# Patient Record
Sex: Male | Born: 1970 | Race: White | Hispanic: No | Marital: Single | Smoking: Current some day smoker
Health system: Southern US, Community
[De-identification: ages and names within clinical notes are randomized; demographics above are authoritative.]

---

## 2006-08-13 ENCOUNTER — Encounter (INDEPENDENT_AMBULATORY_CARE_PROVIDER_SITE_OTHER): Payer: Self-pay | Admitting: Specialist

## 2006-08-14 ENCOUNTER — Observation Stay (HOSPITAL_COMMUNITY): Admission: EM | Admit: 2006-08-14 | Discharge: 2006-08-14 | Payer: Self-pay | Admitting: Emergency Medicine

## 2007-03-24 IMAGING — CR DG ABDOMEN 2V
2 series · 2 of 2 positions shown · non-contrast
Comparison: None

CLINICAL DATA: Abdominal pain

ABDOMEN - 2 VIEW

[w abdomen upright]
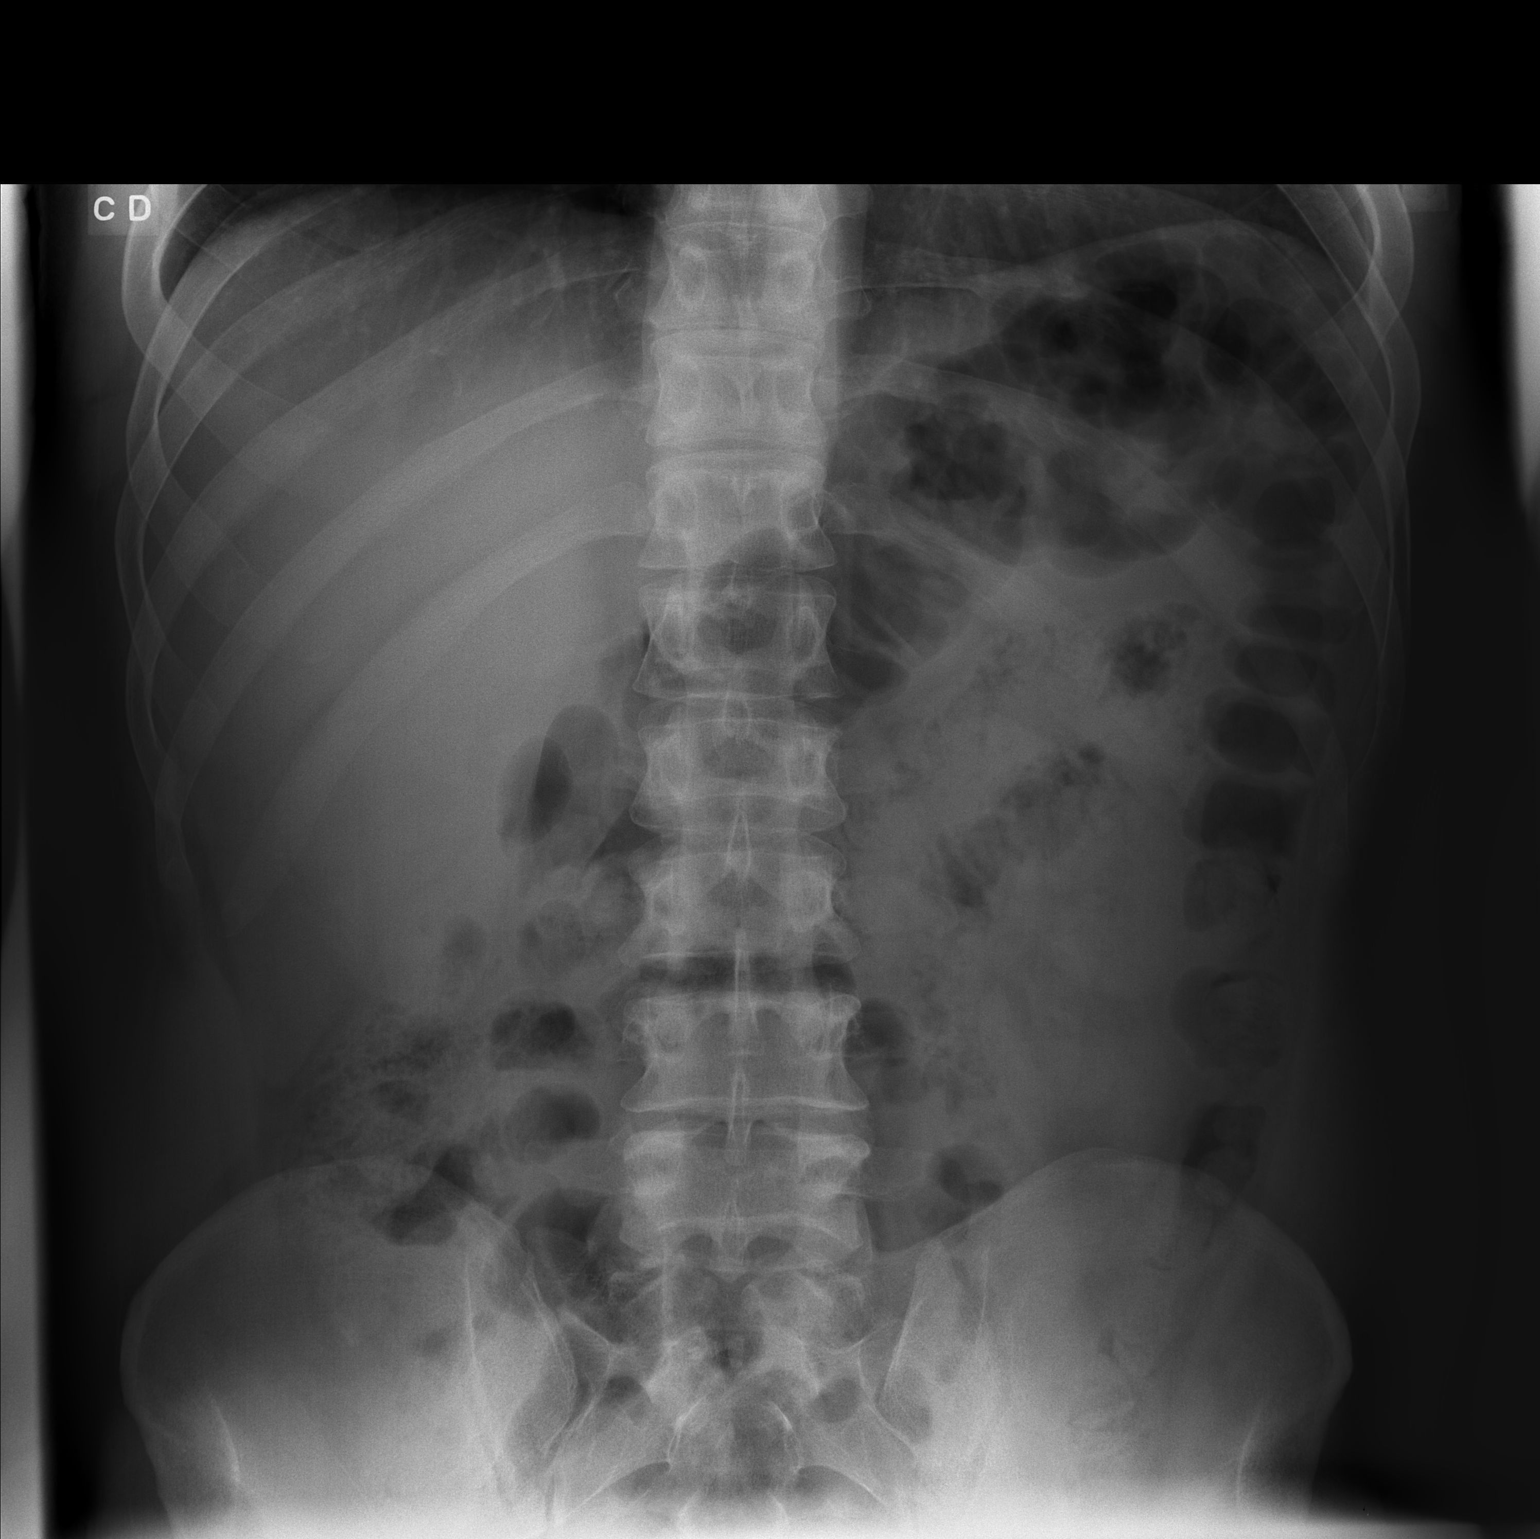

[t abdomen supine]
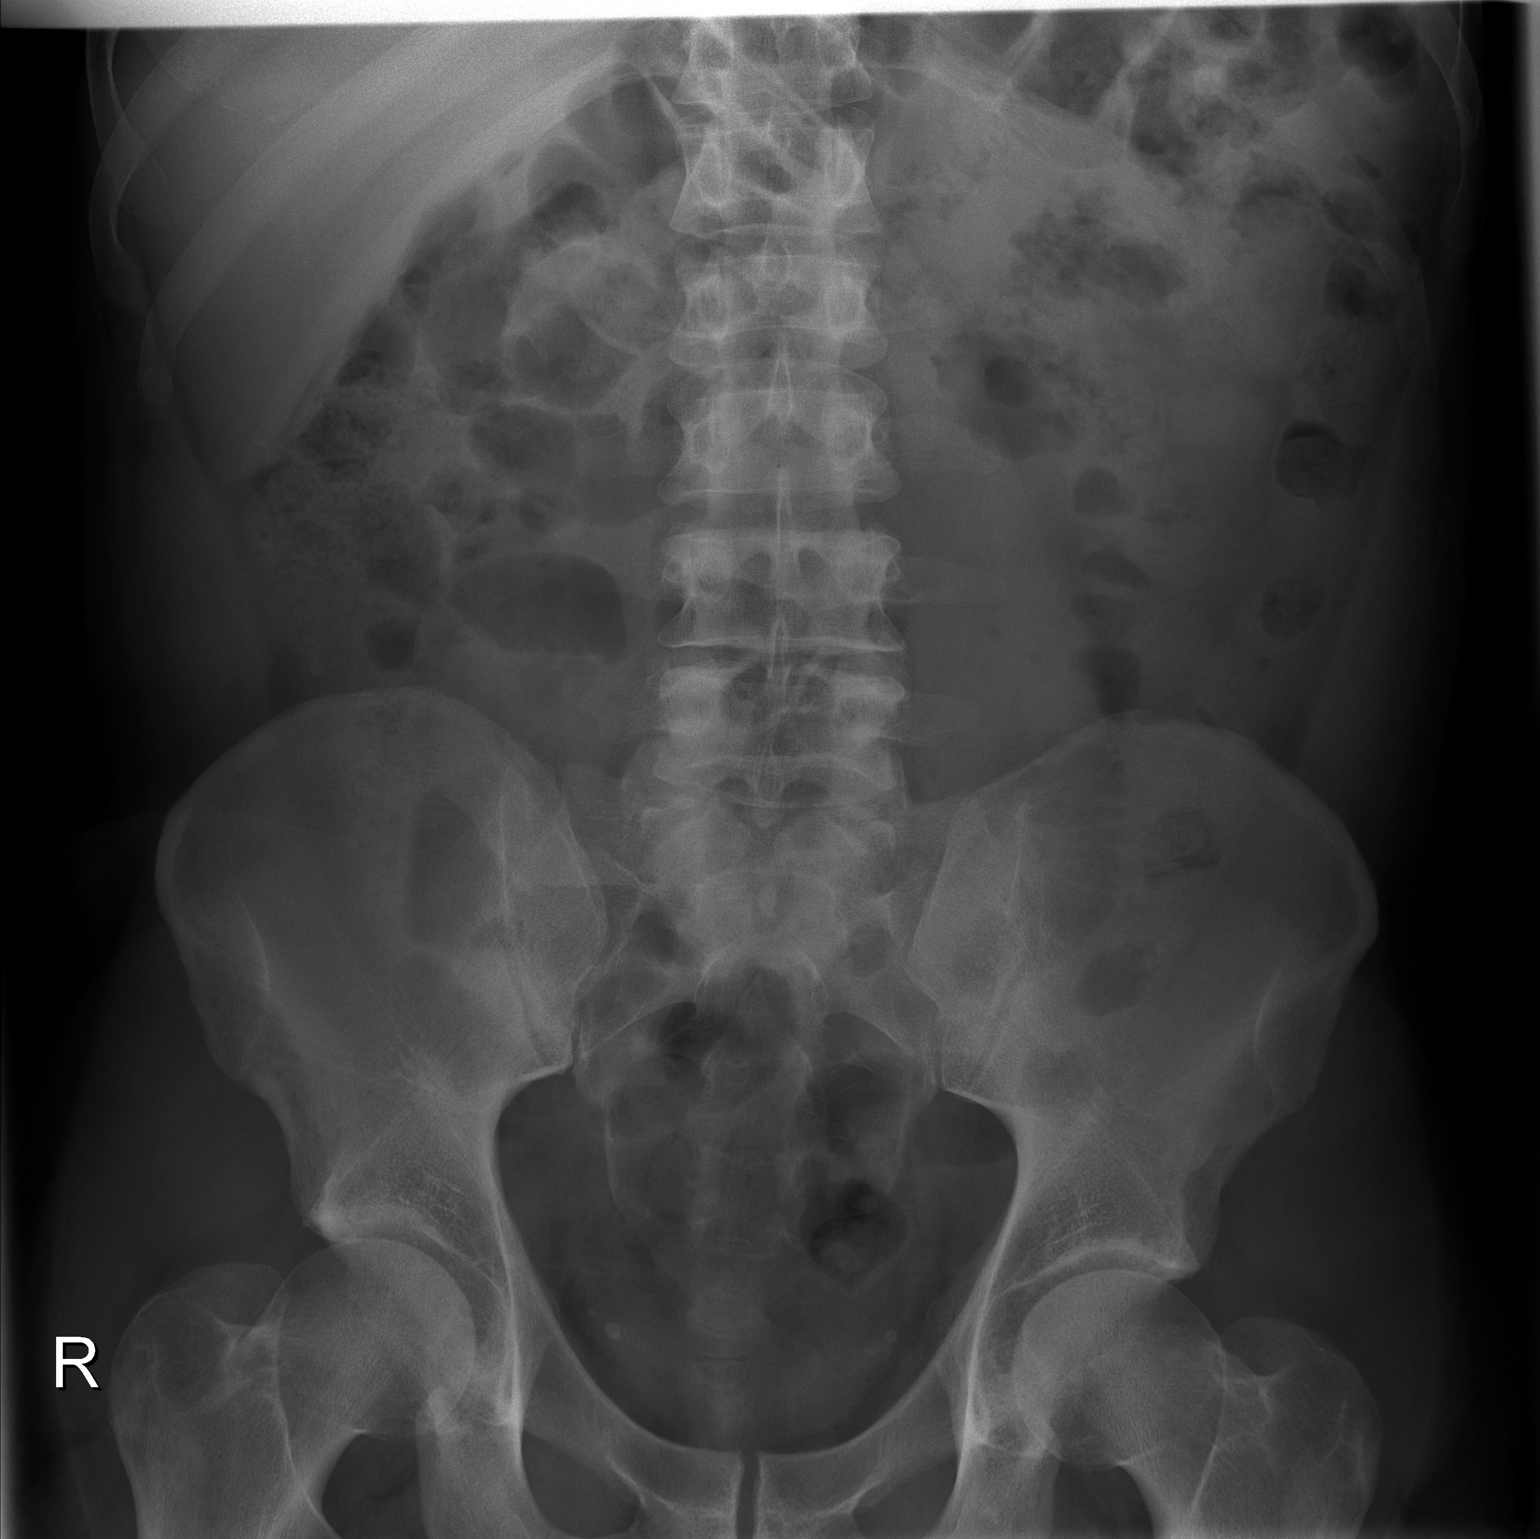

[2 of 2 positions shown; findings below may reference images not displayed]

FINDINGS: There is a nonobstructive bowel gas pattern. No free air,
organomegaly or suspicious calcification. Visualized skeleton unremarkable.

IMPRESSION

No obstruction or free air.

## 2009-12-12 ENCOUNTER — Telehealth (INDEPENDENT_AMBULATORY_CARE_PROVIDER_SITE_OTHER): Payer: Self-pay | Admitting: *Deleted

## 2009-12-26 ENCOUNTER — Ambulatory Visit: Payer: Self-pay | Admitting: Internal Medicine

## 2009-12-26 DIAGNOSIS — J309 Allergic rhinitis, unspecified: Secondary | ICD-10-CM | POA: Insufficient documentation

## 2009-12-26 LAB — CONVERTED CEMR LAB
Basophils Absolute: 0 10*3/uL (ref 0.0–0.1)
Eosinophils Absolute: 0.1 10*3/uL (ref 0.0–0.7)
IgE (Immunoglobulin E), Serum: 8.5 intl units/mL (ref 0.0–180.0)
Lymphocytes Relative: 33 % (ref 12.0–46.0)
MCHC: 35.1 g/dL (ref 30.0–36.0)
Neutrophils Relative %: 53.9 % (ref 43.0–77.0)
Platelets: 222 10*3/uL (ref 150.0–400.0)
RBC: 5.21 M/uL (ref 4.22–5.81)
RDW: 13.1 % (ref 11.5–14.6)

## 2010-02-22 ENCOUNTER — Ambulatory Visit: Payer: Self-pay | Admitting: Internal Medicine

## 2010-02-22 DIAGNOSIS — J328 Other chronic sinusitis: Secondary | ICD-10-CM | POA: Insufficient documentation

## 2010-10-23 ENCOUNTER — Telehealth (INDEPENDENT_AMBULATORY_CARE_PROVIDER_SITE_OTHER): Payer: Self-pay | Admitting: *Deleted

## 2010-10-24 NOTE — Miscellaneous (Signed)
Summary: Skin Test/[facility]  Skin Test/[facility]   Imported By: Sherian Rein 03/01/2010 13:10:22  _____________________________________________________________________  External Attachment:    Type:   Image     Comment:   External Document

## 2010-10-24 NOTE — Assessment & Plan Note (Signed)
Summary: aleergy skin test//kp   Vital Signs:  Patient profile:   40 year old male Height:      72 inches Weight:      230 pounds BMI:     31.31 O2 Sat:      96 % on Room air Pulse rate:   77 / minute BP sitting:   130 / 80  (left arm) Cuff size:   large  Vitals Entered By: Reynaldo Minium CMA (February 22, 2010 2:02 PM)  O2 Flow:  Room air  Primary Provider/Referring Provider:  Elvera Lennox PA  CC:  Allergy testing.  History of Present Illness: History of Present Illness: December 26, 2009- 38 yoM seen for allergy evaluation at kind request of Elvera Lennox, [place], [place] [facility].Never smoker who has a family member seen here. He reports sinus congestion with headaches that put him to bed as a child. Starting again in the 1990's he has noted seasonal problem with bad sore throats/ strep, in the Spring, Fall and Winter. Sneezing and nasal congestion have become perennial. Frequent retroorbital headache, postnasal drip, ears pop. Last Fall he was given prednisone and antivert. Most recent antibiotic was Z pak. In the past has used Rhinocort and fluticasone. Recently trying Allegra. Has not had CT of sinuses, or any ENT surgery. Denies lower respiratory wheeze, cough or chest tightness. Childhood rash from strawberies, food coloring. Environment- House, crawlspace, Shitzu dog and a cat, no smokers, no mold, 3 children. Office based job.  February 22, 2010- Allergic rhinitis, chronic rhinosinusitis History reviewed. Thinks he is in "4th round' on nasal congestion" since January. Sometimes hears a little wheeze. Has not had a rescue inhaler.  Last allegra may have been 48 hours ago- not less. CBC- NL IgE 8.5, Specific allergy profile- Neg  Skin test- Pos: Dust, Mold, Grass, Weed   Preventive Screening-Counseling & Management  Alcohol-Tobacco     Smoking Status: never  Current Medications (verified): 1)  Fluticasone Propionate 50 Mcg/act Susp (Fluticasone Propionate) .Marland Kitchen.. 1-2 Sprays in Each  Nostril Two Times A Day 2)  Allegra 180 Mg Tabs (Fexofenadine Hcl) .... Take 1 By Mouth Once Daily  Allergies (verified): No Known Drug Allergies  Past History:  Past Surgical History: Last updated: 12/26/2009 Appendectomy  Family History: Last updated: 12/26/2009 Cancer: Father-skin,brain tumor               Mother-Liver, died Sister- asthma  Social History: Last updated: 12/26/2009 Married with children Non-smoker; occasionally cigar (once a year). 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  The patient denies shortness of breath with activity, shortness of breath at rest, productive cough, non-productive cough, coughing up blood, chest pain, irregular heartbeats, acid heartburn, indigestion, loss of appetite, weight change, abdominal pain, difficulty swallowing, sore throat, tooth/dental problems, and headaches.    Physical Exam  Additional Exam:  General: A/Ox3; pleasant and cooperative, NAD, healthy appearing SKIN: no rash, lesions NODES: no lymphadenopathy HEENT: [place]/AT, EOM- WNL, Conjuctivae- clear, PERRLA, TM-WNL, Nose- turbinate edema, sniffing Throat- clear and wnl, Mallampati  II-III NECK: Supple w/ fair ROM, JVD- none, normal carotid impulses w/o bruits Thyroid- normal to palpation CHEST: transient wheeze right scapula HEART:  RRR, no m/g/r heard ABDOMEN: Soft and nl; ZOX:WRUE, nl pulses, no edema  NEURO: Grossly intact to observation     Impression & Recommendations:  Problem # 1:  RHINOSINUSITIS, CHRONIC (ICD-473.8) Combination of recurrent infections and seasonal allergy. We discussed dust control measures, exposure to his [place] childrren,  antihistamines, nasal saline rinse and nasal steroids. He will return in the Fall for update. Allergy vaccine and CT sinus are considerations.  Problem # 2:  RHINOSINUSITIS, ALLERGIC (ICD-477.9) We reviewed and compared skin test with in vitro results. Discussed role of nasal steroids. His updated medication list for this problem includes:    Fluticasone Propionate 50 Mcg/act Susp (Fluticasone propionate) .Marland Kitchen... 1-2 sprays in each nostril two times a day    Allegra 180 Mg Tabs (Fexofenadine hcl) .Marland Kitchen... Take 1 by mouth once daily  Other Orders: Est. Patient Level III (45409) Allergy Puncture Test (81191) Allergy I.D Test (47829)  Patient Instructions: 1)  Please schedule a follow-up appointment in 3 months. 2)  Consider the dust control measures we discussed for your home and work place. 3)  Consider regular use of a nasal saline sinus rinse when your see trouble coming. Continue use of nasal steroid spray, and use antihistamine when needed.

## 2010-10-24 NOTE — Progress Notes (Signed)
Summary: patient needs sooner apt  Phone Note From Other Clinic Call back at [phone]   Caller: shirley ([facility]) Call For: Brian Pitts Reason for Call: Schedule Patient Appt Summary of Call: pat has an apt on 4/12. scott gurley (the pa at [facility]) would like him to be worked in sooner if possible.  Initial call taken by: Valinda Hoar,  December 12, 2009 12:15 PM  Follow-up for Phone Call        Please advise of any sooner appts for this pt.Brian Pitts [facility]  December 12, 2009 12:23 PM  Additional Follow-up for Phone Call Additional follow up Details #1::        Please check that an earlier appt was made- Florentina Addison was to work on this. Speak to me about use of any RN slots. Additional Follow-up by: Waymon Budge MD,  December 13, 2009 10:41 AM    Additional Follow-up for Phone Call Additional follow up Details #2::    spoke to shirley at [facility] ph# [phone], offered appt for 4/4 at 11:15, pt to arrive at 11am for allergy consult per scott gurley the pa there, this appt was approved by dr Brian Pitts. shirley states she will call pt with all info and also make pa aware, i told her if this appt did not work for pt to give [place] a call back and we could speak to cy again Follow-up by: Philipp Deputy CMA,  December 13, 2009 11:30 AM

## 2010-10-24 NOTE — Assessment & Plan Note (Signed)
Summary: allergy symptoms per scott gurley-PA @ [facility]   Vital Signs:  Patient profile:   40 year old male Height:      72 inches Weight:      228.38 pounds BMI:     31.09 O2 Sat:      96 % on Room air Pulse rate:   69 / minute BP sitting:   132 / 74  (left arm) Cuff size:   regular  Vitals Entered By: Reynaldo Minium CMA (December 26, 2009 11:16 AM)  O2 Flow:  Room air  Primary Provider/Referring Provider:  Elvera Lennox PA   History of Present Illness: December 26, 2009- 38 yoM seen for allergy evaluation at kind request of Elvera Lennox, [place], [place] [facility].Never smoker who has a family member seen here. He reports sinus congestion with headaches that put him to bed as a child. Starting again in the 1990's he has noted seasonal problem with bad sore throats/ strep, in the Spring, Fall and Winter. Sneezing and nasal congestion have become perennial. Frequent retroorbital headache, postnasal drip, ears pop. Last Fall he was given prednisone and antivert. Most recent antibiotic was Z pak. In the past has used Rhinocort and fluticasone. Recently trying Allegra. Has not had CT of sinuses, or any ENT surgery. Denies lower respiratory wheeze, cough or chest tightness. Childhood rash from strawberies, food coloring. Environment- House, crawlspace, Shitzu dog and a cat, no smokers, no mold, 3 children. Office based job.  Current Medications (verified): 1)  Fluticasone Propionate 50 Mcg/act Susp (Fluticasone Propionate) .Marland Kitchen.. 1-2 Sprays in Each Nostril Two Times A Day 2)  Allegra 180 Mg Tabs (Fexofenadine Hcl) .... Take 1 By Mouth Once Daily  Allergies (verified): No Known Drug Allergies  Past History:  Family History: Last updated: 12/26/2009 Cancer: Father-skin,brain tumor               Mother-Liver, died Sister- asthma  Social History: Last updated: 12/26/2009 Married with children Non-smoker; occasionally cigar (once a year). 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  The patient denies shortness of breath with activity, shortness of breath at rest, productive cough, non-productive cough, coughing up blood, chest pain, irregular heartbeats, acid heartburn, indigestion, loss of appetite, weight change, abdominal pain, difficulty swallowing, tooth/dental problems, itching, anxiety, depression, hand/feet swelling, joint stiffness or pain, rash, change in color of mucus, and fever.    Physical Exam  Additional Exam:  General: A/Ox3; pleasant and cooperative, NAD, healthy appearing SKIN: no rash, lesions NODES: no lymphadenopathy HEENT: [place]/AT, EOM- WNL, Conjuctivae- clear, PERRLA, TM-WNL, Nose- turbinate edema, Throat- clear and wnl, Mallampati  II-III NECK: Supple w/ fair ROM, JVD- none, normal carotid impulses w/o bruits Thyroid- normal to palpation CHEST: Clear to P&A HEART: RRR, no m/g/r heard ABDOMEN: Soft and nl; nml bowel sounds; no organomegaly or masses noted ZOX:WRUE, nl pulses, no edema  NEURO: Grossly intact to observation     Impression & Recommendations:  Problem # 1:  RHINOSINUSITIS, ALLERGIC (ICD-477.9)  He gets congested episodically and has a lot of exposure to young people, so a signigificant infectious/ viral component may be present. There does seem to be an atopic component as well. We will have him use the fluticasone regularly now, while he is open. Add Sudafed PE when needed. Get in vitro specific IgE now  and return foir Skin Testing. Consider CT sinuses later. His updated medication list for this problem includes:    Fluticasone Propionate 50 Mcg/act Susp (Fluticasone propionate) .Marland Kitchen... 1-2 sprays in each nostril two times a day    Allegra 180 Mg Tabs (Fexofenadine hcl) .Marland Kitchen... Take 1 by mouth once daily  Medications Added to Medication List This Visit: 1)  Fluticasone Propionate 50 Mcg/act Susp (Fluticasone propionate) .Marland Kitchen.. 1-2 sprays in each nostril two times a day 2)  Allegra 180 Mg Tabs (Fexofenadine hcl) .... Take 1 by mouth once daily  Other Orders: Consultation Level III (09811) T-"RAST" (Allergy Full Profile) IGE (91478-29562) TLB-CBC Platelet - w/Differential (85025-CBCD)  Patient Instructions: 1)  Return as able for allergy skin testing- Stop all antihistamines 3 days before skin testing, including cold and allergy meds, otc sleep and cough meds. This will include allegra. 2)  Use fluticasone nasal steroid spray: 1-2 puffs each nostril once daily at bedtime. 3)  Try otc decongestant Sudafed-PE as needed 4)  Lab

## 2010-11-01 NOTE — Progress Notes (Signed)
Summary: rx req  Phone Note Call from Patient Call back at Home Phone [phone]   Caller: Patient Call For: young Summary of Call: needs a new rx for fluticasone. [facility] on [address]/ [address].  Initial call taken by: Tivis Ringer, CNA,  October 23, 2010 9:32 AM  Follow-up for Phone Call        [facility] Gweneth Dimitri RN  October 23, 2010 10:08 AM  [facility]- needs Marica Otter  October 24, 2010 5:08 PM patient phoned returning a call he can be reached [phone]. Vedia Coffer  October 25, 2010 1:41 PM   Additional Follow-up for Phone Call Additional follow up Details #1::        Called and spoke with pt.  I advised that he needs ov- last seen June 2011 and was asked to followup in 3 months and never sched appt.  He states that he is doing well and does not feel like followup is needed now.  I advised will ask CDY is okay to refill  med, but also that if we are refilling meds for him he will at least need to followup annually.  Pls advise thanks Additional Follow-up by: Vernie Murders,  October 25, 2010 2:00 PM    Additional Follow-up for Phone Call Additional follow up Details #2::    Per CDY-agree to Flonase/Fluticasone nasal spray x 1 then apt here or get it from his PCP.Reynaldo Minium CMA  October 25, 2010 2:16 PM   Called and spoke with pt and advised of the above recs per CDY.  He states that he would like to think about whether he wants to followup here in 1 month or with PCP and then call back if wants to sched appt.  I advised that rx was sent x 1 only.  Follow-up by: Vernie Murders,  October 25, 2010 2:21 PM  Prescriptions: FLUTICASONE PROPIONATE 50 MCG/ACT SUSP (FLUTICASONE PROPIONATE) 1-2 sprays in each nostril two times a day  #1 x 0   Entered by:   Vernie Murders   Authorized by:   Waymon Budge MD   Signed by:   Vernie Murders on 10/25/2010   Method used:   Electronically to        [facility] # [phone]* (retail)       [address]  [place], [place]  [address]       Ph: [phone]       Fax: [phone]   RxID:   [id number]

## 2011-02-09 NOTE — H&P (Signed)
Brian Pitts, Brian Pitts NO.:  [id number]   MEDICAL RECORD NO.:  [id number]          PATIENT TYPE:  EMS   LOCATION:  ED                           [facility]AC[facility]   PHYSICIAN:  Lebron Conners, M.D.   DATE OF BIRTH:  [birth year]   DATE OF ADMISSION:  08/13/2006  DATE OF DISCHARGE:                                HISTORY & PHYSICAL   CHIEF COMPLAINT:  Abdominal pain.   PRESENT ILLNESS:  The patient is a healthy 40 year old man who was awakened  from sleep by mid abdominal and rather diffuse abdominal pain about 18 hours  ago at 4:00 in the morning.  He went back to sleep but woke up again soon  thereafter with right lower quadrant pain, and he has had anorexia and  nausea all day, although he has not vomited.  There has been no diarrhea.  He has had some fever, and he also was continuing to have pain, particularly  when he moved around.  It has been restricted to his right lower quadrant.  He finally went to an Urgent Care Center where was found have a fever and a  white count of 19,000, hemoglobin 16, and he was sent to the emergency  department for attention.  At the emergency department, abdominal x-ray was  done showing no abnormality.  The patient has had no urinary symptoms.   PAST MEDICAL HISTORY:  The the patient has no serious chronic ailments at  all.  He has some allergies for which he takes Rhinocort.  He has not had  any operations.  Never been hospitalized.   REVIEW OF SYSTEMS:  Unremarkable, entire 15 points.  The patient does not  smoke.  He drinks alcoholic beverages occasionally and moderately.   FAMILY HISTORY AND CHILDHOOD ILLNESSES:  Unremarkable.   PHYSICAL EXAMINATION:  VITAL SIGNS:  Temperature and vital signs as recorded  by nursing staff.  His temperature has been elevated up to 101.  HEENT:  Unremarkable.  CHEST:  Clear to auscultation.  HEART:  Rate and rhythm were normal.  No murmur or gallop is present.  ABDOMEN:  Fairly soft but  markedly tender with some resistance to  examination in the right lower quadrant rather laterally.  Mild rebound  tenderness is present.  No masses present.  Bowel sounds are decreased.  EXTREMITIES:  Normal.  No edema.  Good pulses.  No skin lesions.  LYMPH NODES:  Not enlarged in the groin.  NEUROLOGICAL:  Grossly normal.   IMPRESSION:  Acute appendicitis.   PLAN:  Laparoscopic appendectomy.  I discussed the operation, convalescence,  risks, and possibilities of doing imaging or observation with the patient.  He accepts my recommendation for immediate laparoscopy and appendectomy, and  we will proceed.      Lebron Conners, M.D.  Electronically Signed     WB/MEDQ  D:  08/13/2006  T:  08/14/2006  Job:  [id number]

## 2011-02-09 NOTE — Op Note (Signed)
Brian Pitts, Brian Pitts              ACCOUNT NO.:  [id number]   MEDICAL RECORD NO.:  [id number]          PATIENT TYPE:  EMS   LOCATION:  ED                           [facility]ACILITY[facility]  [facility]   PHYSICIAN:  Lebron Conners, M.D.   DATE OF BIRTH:  03-01-1971   DATE OF PROCEDURE:  08/13/2006  DATE OF DISCHARGE:                                 OPERATIVE REPORT   PRE AND POSTOPERATIVE DIAGNOSIS:  Acute appendicitis.   OPERATION:  Laparoscopic appendectomy.   SURGEON:  Lebron Conners, M.D.   ANESTHESIA:  General and local.   BLOOD LOSS:  Minimal.   COMPLICATIONS:  None.   SPECIMEN:  Appendix.   PROCEDURE:  After the patient was monitored and asleep and had routine  preparation and draping of the abdomen and had a Foley catheter in place in  the bladder, I made a short midline infraumbilical incision through a site  which I anesthetized with long-acting local anesthetic.  I cut the fascia  for about 2 cm in the midline and bluntly entered the peritoneal cavity.  I  placed a 0 Vicryl pursestring suture in the fascia and secured a Hassan  cannula with that.  After inflating the abdomen with carbon dioxide, I put  in laparoscopic and then placed a 5 mm right upper quadrant port and an 11  mm left lower quadrant port under direct view with no visceral injuries  occurring with insertion of the ports.  I then viewed the right lower  quadrant and saw evidence of inflammation.  I mobilized the cecum little bit  and saw an acutely inflamed appendix.  I grasped that with a large grasper  and elevated it and noted that it was healthy appearing at the base.  I then  thinned the mesentery a little bit with the cautery and stapled across the  appendix and the mesentery with two firings of the endovascular cutting  stapler.  I made a smooth hemostatic amputation of the appendix.  I  irrigated the area briefly and removed the irrigant.  I saw no fluid in the  pelvis.  I put the appendix in a plastic pouch and  removed it through the  umbilical incision and tied the pursestring suture.  I removed the right  upper quadrant port under direct view and saw no bleeding from the belly  wall.  After allowing the carbon dioxide to escape, I removed the left lower  quadrant port.  I closed all skin incisions with intracuticular 4-0 Vicryl  and Steri-Strips.  The patient tolerated the operation well and went to PACU  in good condition.      Lebron Conners, M.D.  Electronically Signed     WB/MEDQ  D:  08/13/2006  T:  08/14/2006  Job:  [id number]

## 2017-12-23 ENCOUNTER — Ambulatory Visit: Payer: BC Managed Care – PPO | Admitting: Podiatry

## 2017-12-23 ENCOUNTER — Other Ambulatory Visit: Payer: Self-pay | Admitting: Podiatry

## 2017-12-23 ENCOUNTER — Ambulatory Visit (INDEPENDENT_AMBULATORY_CARE_PROVIDER_SITE_OTHER): Payer: BC Managed Care – PPO

## 2017-12-23 ENCOUNTER — Encounter: Payer: Self-pay | Admitting: Podiatry

## 2017-12-23 VITALS — BP 142/91 | HR 69 | Resp 16

## 2017-12-23 DIAGNOSIS — M779 Enthesopathy, unspecified: Secondary | ICD-10-CM

## 2017-12-23 DIAGNOSIS — M79671 Pain in right foot: Secondary | ICD-10-CM | POA: Diagnosis not present

## 2017-12-23 MED ORDER — TRIAMCINOLONE ACETONIDE 10 MG/ML IJ SUSP
10.0000 mg | Freq: Once | INTRAMUSCULAR | Status: AC
  Administered 2017-12-23: 10 mg

## 2017-12-23 MED ORDER — DICLOFENAC SODIUM 75 MG PO TBEC: 75.0000 mg | DELAYED_RELEASE_TABLET | Freq: Two times a day (BID) | ORAL | 2 refills | Status: AC

## 2017-12-23 NOTE — Progress Notes (Signed)
Subjective:   Patient ID: Brian Pitts, male   DOB: 47 y.o.   MRN: [id number]   HPI Patient presents with a lot of pain in the outside of the right foot that has been present for around 6 months.  Does not remember specific injury and does not remember turning his foot.  Patient is a cigar smoker and likes to be active   Review of Systems  All other systems reviewed and are negative.       Objective:  Physical Exam  Constitutional: He appears well-developed and well-nourished.  Cardiovascular: Intact distal pulses.  Pulmonary/Chest: Effort normal.  Musculoskeletal: Normal range of motion.  Neurological: He is alert.  Skin: Skin is warm.  Nursing note and vitals reviewed.   Neurovascular status found to be intact muscle strength is adequate range of motion within normal limits with patient found to have discomfort around the base of the fifth metatarsal right and slightly proximal to this.  It is very tender when pressed and I did not note any muscle strength loss or damage at the current time.  Patient was found to have good digital perfusion and is well oriented x3     Assessment:  Peroneal tendinitis right with slight proximal and medial pain to this area also noted with inflammation around the base of the fifth metatarsal     Plan:  H&P condition reviewed with patient and x-ray reviewed.  I carefully injected the base with 3 mg dexamethasone Kenalog 5 mg Xylocaine advised on ice reduced activity and if symptoms persist will be seen back in the next 2-3 weeks.  Placed on diclofenac 75 mg twice daily for 3 weeks and instructed on what to do of any pain should remain.  Reappoint as needed  X-ray indicates that there is slight swelling around the base of the fifth metatarsal but no signs of other pathology

## 2018-10-02 ENCOUNTER — Telehealth: Payer: Self-pay | Admitting: *Deleted

## 2018-10-02 NOTE — Telephone Encounter (Signed)
Patient's appointment has been cancelled because his sleep therapy will be [facility] by [facility] Carilyn Goodpasture(Jennifer Willard PA-C).per Laurin.

## 2019-01-05 ENCOUNTER — Ambulatory Visit: Payer: Self-pay | Admitting: Cardiology
